# Patient Record
Sex: Female | Born: 1970 | Race: White | Hispanic: No | Marital: Married | Smoking: Former smoker
Health system: Southern US, Community
[De-identification: ages and names within clinical notes are randomized; demographics above are authoritative.]

## PROBLEM LIST (undated history)

## (undated) DIAGNOSIS — I10 Essential (primary) hypertension: Secondary | ICD-10-CM

## (undated) DIAGNOSIS — F410 Panic disorder [episodic paroxysmal anxiety] without agoraphobia: Secondary | ICD-10-CM

## (undated) DIAGNOSIS — E669 Obesity, unspecified: Secondary | ICD-10-CM

## (undated) DIAGNOSIS — F32A Depression, unspecified: Secondary | ICD-10-CM

## (undated) DIAGNOSIS — D649 Anemia, unspecified: Secondary | ICD-10-CM

## (undated) DIAGNOSIS — K219 Gastro-esophageal reflux disease without esophagitis: Secondary | ICD-10-CM

## (undated) DIAGNOSIS — R0789 Other chest pain: Secondary | ICD-10-CM

## (undated) DIAGNOSIS — F329 Major depressive disorder, single episode, unspecified: Secondary | ICD-10-CM

## (undated) HISTORY — DX: Panic disorder (episodic paroxysmal anxiety): F41.0

## (undated) HISTORY — DX: Gastro-esophageal reflux disease without esophagitis: K21.9

## (undated) HISTORY — DX: Major depressive disorder, single episode, unspecified: F32.9

## (undated) HISTORY — PX: VESICOVAGINAL FISTULA CLOSURE W/ TAH: SUR271

## (undated) HISTORY — DX: Obesity, unspecified: E66.9

## (undated) HISTORY — DX: Depression, unspecified: F32.A

## (undated) HISTORY — PX: TONSILLECTOMY AND ADENOIDECTOMY: SHX28

## (undated) HISTORY — DX: Essential (primary) hypertension: I10

## (undated) HISTORY — DX: Other chest pain: R07.89

## (undated) HISTORY — PX: OTHER SURGICAL HISTORY: SHX169

---

## 1997-11-07 ENCOUNTER — Other Ambulatory Visit: Admission: RE | Admit: 1997-11-07 | Discharge: 1997-11-07 | Payer: Self-pay | Admitting: Obstetrics and Gynecology

## 1998-04-29 ENCOUNTER — Inpatient Hospital Stay (HOSPITAL_COMMUNITY): Admission: AD | Admit: 1998-04-29 | Discharge: 1998-05-01 | Payer: Self-pay | Admitting: Obstetrics and Gynecology

## 1998-05-24 ENCOUNTER — Inpatient Hospital Stay (HOSPITAL_COMMUNITY): Admission: AD | Admit: 1998-05-24 | Discharge: 1998-05-27 | Payer: Self-pay | Admitting: Obstetrics & Gynecology

## 1998-05-28 ENCOUNTER — Encounter (HOSPITAL_COMMUNITY): Admission: RE | Admit: 1998-05-28 | Discharge: 1998-08-26 | Payer: Self-pay | Admitting: *Deleted

## 1998-06-22 ENCOUNTER — Other Ambulatory Visit: Admission: RE | Admit: 1998-06-22 | Discharge: 1998-06-22 | Payer: Self-pay | Admitting: Obstetrics and Gynecology

## 1998-10-25 ENCOUNTER — Encounter: Payer: Self-pay | Admitting: Emergency Medicine

## 1998-10-25 ENCOUNTER — Emergency Department (HOSPITAL_COMMUNITY): Admission: EM | Admit: 1998-10-25 | Discharge: 1998-10-25 | Payer: Self-pay | Admitting: Emergency Medicine

## 1999-06-03 ENCOUNTER — Encounter: Admission: RE | Admit: 1999-06-03 | Discharge: 1999-09-01 | Payer: Self-pay | Admitting: *Deleted

## 2000-07-26 ENCOUNTER — Encounter: Admission: RE | Admit: 2000-07-26 | Discharge: 2000-07-26 | Payer: Self-pay | Admitting: Otolaryngology

## 2000-07-26 ENCOUNTER — Encounter: Payer: Self-pay | Admitting: Otolaryngology

## 2001-09-04 ENCOUNTER — Other Ambulatory Visit: Admission: RE | Admit: 2001-09-04 | Discharge: 2001-09-04 | Payer: Self-pay | Admitting: *Deleted

## 2001-09-06 ENCOUNTER — Encounter: Admission: RE | Admit: 2001-09-06 | Discharge: 2001-09-06 | Payer: Self-pay | Admitting: *Deleted

## 2001-09-06 ENCOUNTER — Encounter: Payer: Self-pay | Admitting: *Deleted

## 2001-11-21 ENCOUNTER — Emergency Department (HOSPITAL_COMMUNITY): Admission: EM | Admit: 2001-11-21 | Discharge: 2001-11-21 | Payer: Self-pay | Admitting: Emergency Medicine

## 2002-10-17 ENCOUNTER — Encounter: Payer: Self-pay | Admitting: Emergency Medicine

## 2002-10-17 ENCOUNTER — Emergency Department (HOSPITAL_COMMUNITY): Admission: EM | Admit: 2002-10-17 | Discharge: 2002-10-17 | Payer: Self-pay | Admitting: Emergency Medicine

## 2002-10-18 ENCOUNTER — Emergency Department (HOSPITAL_COMMUNITY): Admission: EM | Admit: 2002-10-18 | Discharge: 2002-10-18 | Payer: Self-pay | Admitting: Emergency Medicine

## 2002-10-18 ENCOUNTER — Inpatient Hospital Stay (HOSPITAL_COMMUNITY): Admission: EM | Admit: 2002-10-18 | Discharge: 2002-10-21 | Payer: Self-pay | Admitting: Specialist

## 2002-11-19 ENCOUNTER — Emergency Department (HOSPITAL_COMMUNITY): Admission: EM | Admit: 2002-11-19 | Discharge: 2002-11-19 | Payer: Self-pay | Admitting: Emergency Medicine

## 2003-03-07 ENCOUNTER — Ambulatory Visit (HOSPITAL_COMMUNITY): Admission: RE | Admit: 2003-03-07 | Discharge: 2003-03-07 | Payer: Self-pay

## 2003-03-11 ENCOUNTER — Ambulatory Visit (HOSPITAL_COMMUNITY): Admission: RE | Admit: 2003-03-11 | Discharge: 2003-03-11 | Payer: Self-pay

## 2003-06-24 ENCOUNTER — Encounter: Admission: RE | Admit: 2003-06-24 | Discharge: 2003-06-24 | Payer: Self-pay | Admitting: Family Medicine

## 2003-08-22 ENCOUNTER — Emergency Department (HOSPITAL_COMMUNITY): Admission: EM | Admit: 2003-08-22 | Discharge: 2003-08-22 | Payer: Self-pay | Admitting: Emergency Medicine

## 2004-06-05 ENCOUNTER — Emergency Department (HOSPITAL_COMMUNITY): Admission: EM | Admit: 2004-06-05 | Discharge: 2004-06-05 | Payer: Self-pay | Admitting: Emergency Medicine

## 2005-05-24 ENCOUNTER — Ambulatory Visit: Payer: Self-pay | Admitting: Internal Medicine

## 2005-05-26 ENCOUNTER — Observation Stay (HOSPITAL_COMMUNITY): Admission: EM | Admit: 2005-05-26 | Discharge: 2005-05-26 | Payer: Self-pay | Admitting: Emergency Medicine

## 2005-06-08 ENCOUNTER — Ambulatory Visit: Payer: Self-pay

## 2005-06-14 ENCOUNTER — Ambulatory Visit: Payer: Self-pay | Admitting: Internal Medicine

## 2005-06-15 ENCOUNTER — Ambulatory Visit: Payer: Self-pay | Admitting: Internal Medicine

## 2005-10-16 ENCOUNTER — Encounter: Admission: RE | Admit: 2005-10-16 | Discharge: 2005-10-16 | Payer: Self-pay | Admitting: *Deleted

## 2008-09-28 ENCOUNTER — Encounter: Admission: RE | Admit: 2008-09-28 | Discharge: 2008-09-28 | Payer: Self-pay | Admitting: Sports Medicine

## 2009-06-03 ENCOUNTER — Ambulatory Visit: Payer: Self-pay | Admitting: Internal Medicine

## 2009-06-03 ENCOUNTER — Inpatient Hospital Stay (HOSPITAL_COMMUNITY): Admission: EM | Admit: 2009-06-03 | Discharge: 2009-06-04 | Payer: Self-pay | Admitting: Emergency Medicine

## 2009-06-09 ENCOUNTER — Encounter (INDEPENDENT_AMBULATORY_CARE_PROVIDER_SITE_OTHER): Payer: Self-pay | Admitting: *Deleted

## 2009-06-09 ENCOUNTER — Telehealth (INDEPENDENT_AMBULATORY_CARE_PROVIDER_SITE_OTHER): Payer: Self-pay

## 2009-06-10 ENCOUNTER — Encounter (HOSPITAL_COMMUNITY): Admission: RE | Admit: 2009-06-10 | Discharge: 2009-08-12 | Payer: Self-pay | Admitting: Internal Medicine

## 2009-06-10 ENCOUNTER — Ambulatory Visit: Payer: Self-pay

## 2009-06-10 ENCOUNTER — Ambulatory Visit: Payer: Self-pay | Admitting: Cardiology

## 2009-06-19 DIAGNOSIS — R0789 Other chest pain: Secondary | ICD-10-CM | POA: Insufficient documentation

## 2009-06-19 DIAGNOSIS — I10 Essential (primary) hypertension: Secondary | ICD-10-CM | POA: Insufficient documentation

## 2009-06-19 DIAGNOSIS — F329 Major depressive disorder, single episode, unspecified: Secondary | ICD-10-CM

## 2009-06-19 DIAGNOSIS — F410 Panic disorder [episodic paroxysmal anxiety] without agoraphobia: Secondary | ICD-10-CM | POA: Insufficient documentation

## 2009-06-19 DIAGNOSIS — K219 Gastro-esophageal reflux disease without esophagitis: Secondary | ICD-10-CM | POA: Insufficient documentation

## 2009-06-19 DIAGNOSIS — E669 Obesity, unspecified: Secondary | ICD-10-CM | POA: Insufficient documentation

## 2009-06-22 ENCOUNTER — Ambulatory Visit: Payer: Self-pay | Admitting: Internal Medicine

## 2010-09-04 ENCOUNTER — Encounter: Payer: Self-pay | Admitting: Internal Medicine

## 2010-09-05 ENCOUNTER — Encounter: Payer: Self-pay | Admitting: Internal Medicine

## 2010-11-18 LAB — CARDIAC PANEL(CRET KIN+CKTOT+MB+TROPI)
CK, MB: 0.8 ng/mL (ref 0.3–4.0)
CK, MB: 0.8 ng/mL (ref 0.3–4.0)
Relative Index: INVALID (ref 0.0–2.5)
Relative Index: INVALID (ref 0.0–2.5)
Total CK: 51 U/L (ref 7–177)
Total CK: 55 U/L (ref 7–177)
Troponin I: 0.01 ng/mL (ref 0.00–0.06)
Troponin I: 0.01 ng/mL (ref 0.00–0.06)

## 2010-11-18 LAB — CBC
HCT: 36.2 % (ref 36.0–46.0)
Hemoglobin: 12 g/dL (ref 12.0–15.0)
MCHC: 33.2 g/dL (ref 30.0–36.0)
MCV: 79.6 fL (ref 78.0–100.0)
Platelets: 343 10*3/uL (ref 150–400)
RBC: 4.55 MIL/uL (ref 3.87–5.11)
RDW: 14.4 % (ref 11.5–15.5)
WBC: 9.1 10*3/uL (ref 4.0–10.5)

## 2010-11-18 LAB — POCT CARDIAC MARKERS
CKMB, poc: <1 ng/mL — ABNORMAL LOW (ref 1.0–8.0)
CKMB, poc: <1 ng/mL — ABNORMAL LOW (ref 1.0–8.0)
Myoglobin, poc: 59 ng/mL (ref 12–200)
Myoglobin, poc: 60.7 ng/mL (ref 12–200)
Troponin i, poc: 0.1 ng/mL — ABNORMAL HIGH (ref 0.00–0.09)
Troponin i, poc: <0.05 ng/mL (ref 0.00–0.09)

## 2010-11-18 LAB — BASIC METABOLIC PANEL
BUN: 14 mg/dL (ref 6–23)
CO2: 28 mEq/L (ref 19–32)
Calcium: 9.1 mg/dL (ref 8.4–10.5)
Chloride: 99 mEq/L (ref 96–112)
Creatinine, Ser: 0.6 mg/dL (ref 0.4–1.2)
GFR calc Af Amer: >60 mL/min (ref >60)
GFR calc non Af Amer: >60 mL/min (ref >60)
Glucose, Bld: 90 mg/dL (ref 70–99)
Potassium: 3.2 mEq/L — ABNORMAL LOW (ref 3.5–5.1)
Sodium: 137 mEq/L (ref 135–145)

## 2010-11-18 LAB — CK TOTAL AND CKMB (NOT AT ARMC)
CK, MB: 1.3 ng/mL (ref 0.3–4.0)
Relative Index: INVALID (ref 0.0–2.5)
Total CK: 61 U/L (ref 7–177)

## 2010-11-18 LAB — TROPONIN I: Troponin I: 0.02 ng/mL (ref 0.00–0.06)

## 2010-12-31 NOTE — H&P (Signed)
Tammy Benton, Tammy Benton               ACCOUNT NO.:  [id number]   MEDICAL RECORD NO.:  [id number]          PATIENT TYPE:  EMS   LOCATION:  ED                           FACILITY:  [facility]   PHYSICIAN:  Mark A. Perini, M.D.   DATE OF BIRTH:  1971-04-14   DATE OF ADMISSION:  05/25/2005  DATE OF DISCHARGE:                                HISTORY & PHYSICAL   CHIEF COMPLAINT:  Chest pain.   HISTORY OF PRESENT ILLNESS:  Tammy Benton is a 40 year old female who presents  reporting on and off chest pain for a couple of days. Today, she has had  chest pain and pressure with associated diaphoresis and shortness of breath.  These episodes come on and off and last several minutes at a time. She  states that she has never had a pain like this before. She also reports left  arm, hand, and left leg numbness symptoms, which are persisting currently.  There has been no definite weakness on the left side and there have been no  other stroke-like symptoms including no visual disturbances and no dysphasia  noted. In the emergency room, her heart room is tachycardiac but other  evaluation is negative so far. We will admit her for further evaluation.   PAST MEDICAL HISTORY:  1.  Post partum depression.  2.  Gastroesophageal reflux disease.  3.  Sinus problems. She reports that she has had 3 sinus infections since      June of 2006.  4.  History of tonsillectomy and adenoidectomy.  5.  History of laparoscopic surgery for endometriosis.  6.  Possible history of hypertension. This has been monitored and she has      been borderline in the past.  7.  History of borderline diabetes per the patient but not on any diabetes      medicines currently.  8.  History of hyperlipidemia/dyslipidemia. Per the patient and not on any      current medicine at this time. This has also been monitored.   ALLERGIES:  No known drug allergies.   MEDICATIONS:  Prednisone taper. She took 60 mg this morning x1. Zoloft 100  mg daily,  Nexium 40 mg daily. She has rare Afrin use. The last time was 1  and 1/2 weeks ago. She uses a lot of Goodies Power, 2 to 3 doses a day. She  has been given Flonase today and took 1 dose of this. She has had __________  D 1/2 tablet today, given to her earlier today and she has been started on  Avelox 400 mg a day for possible sinus infection, again first dose today.   SOCIAL HISTORY:  She is married. She has 8 children but only 1 biologic  child. Seven are step-children. She did have some smoking history as a  teenager but quit at age 29. She uses occasional alcohol. No drug use.   FAMILY HISTORY:  Mother is alive at age 7 with some allergy problems and an  enlarged heart but no definite coronary disease. Father died of age 77 of  congestive heart failure. He had his  first sign of heart disease in his  early 2's. He had bypass at age 33 and type 2 diabetes.   REVIEW OF SYSTEMS:  She has been weak. She has lost 20 pounds intentionally  recently. Two weeks ago, she had a headache, cough, congestion, drainage,  and ear pain. She did take a course of Augmentin, which was completed about  2 weeks ago. She denies any fevers. She has had significant sinus type of  symptoms in the last week, with significant pressure behind her eyes and in  her maxillary sinus area. She went to an urgent care today and was then  given the prednisone prescription as well as the Avelox, Flonase, and the  decongestant. She does report left hand and left leg numbness today. There  has been no definite weakness. The chest pain and chest pain pressures have  been ongoing during the day today, on and off, although she is currently  pain free.   PHYSICAL EXAMINATION:  VITAL SIGNS:  Temperature 96.6, blood pressure  145/87, pulse 124 and sinus rhythm. Respiratory rate 20. Oxygen saturation  98%. Weight is 222 pounds. She is an obese female who appears older than her  stated age. She has poor dentition.  HEENT:   Normocephalic and atraumatic. Pupils are equal, round, and reactive  to light. Extraocular movements are intact. There are no bruits. Tympanic  membranes are normal bilaterally. Nares are clear bilaterally. Oropharynx is  clear.  LUNGS:  Clear to auscultation bilaterally with no wheezes, rales, or  rhonchi.  HEART:  Tachycardiac with a 1 to 2 over 6 murmur heard at the left sternal  border in early systole. Heart sounds are hyper-dynamic.  ABDOMEN:  Soft, obese, non-tender with no masses.  EXTREMITIES:  There is no peripheral edema.  NEUROLOGIC:  Cranial nerves 2-12 are normal. She moves extremities x4 and  has normal strength in the upper and lower extremities. She has 2+ deep  tendon reflexes throughout the upper and lower extremities. There is no  definite sensory changes to palpation. She has no pronator drift and no  Romberg sign.   LABORATORY DATA:  Chest x-ray personally reviewed shows no active disease.   EKG shows sinus tachycardia with a rate of 118 beats per minute. Normal axis  and RSR primed pattern. There is normal R wave progression. No pathologic Q  waves. There are some inferior ST and T wave flattening but this is  unchanged compared to a January of 2005 EKG.   Sodium 134, potassium 3.7, chloride 102, CO2 22, BUN 12, creatinine 0.6,  glucose 169, calcium 9.2. White count is 11.5 with 91% segs, 8% lymphocytes,  1% monocyte. Hemoglobin 12.6. Platelet count 409,000. D-dimer is negative at  less than 0.22. One set of cardiac enzymes shows a myoglobin of 39.8, MB of  less than 1.0 and a troponin I of less than 0.05.   ASSESSMENT/PLAN:  A 40 year old female with risk factors for atherosclerotic  coronary artery disease including probable hypertension, obesity, and  possible sedentary lifestyle, strong family history of coronary disease, and  she also has dysmetabolic syndrome by her history. She does have atypical chest symptoms at this time. Her enzymes are negative and  her initial EKG  does not show any definitive changes, although she is tachycardiac. We will  admit her and rule her out with serial enzymes and EKG's. Will obtain a  cardiology consult for further evaluation. I have advised her to stop  Goodies Powder use. I have  advised her against using Afrin. We will continue  PPI and in fact, we will give her twice daily Protonix while she is in  house. I will stop her prednisone currently but I will continue her Avelox  for possible underlying sinus infection. Will place her on deep vein  thrombosis prophylactic dose  Lovenox. Will continue with the Protonix for ulcer prophylaxis and treatment  of her underlying reflux. She also has left sided numbness symptoms, which  are vague. There is no objective neurologic symptoms currently. This will  need to be monitored and I will defer to the patient's primary physician,  whether any imaging is warranted.           ______________________________  Redge Gainer. Waynard Edwards, M.D.     MAP/MEDQ  D:  05/26/2005  T:  05/26/2005  Job:  [id number]   cc:   Kari Baars, M.D.  Fax: [phone]

## 2010-12-31 NOTE — Discharge Summary (Signed)
[name], Benton               ACCOUNT NO.:  [id number]   MEDICAL RECORD NO.:  [id number]          PATIENT TYPE:  OBV   LOCATION:  0151                         FACILITY:  [facility]   PHYSICIAN:  Kari Baars, M.D.  DATE OF BIRTH:  [birth year]   DATE OF ADMISSION:  05/25/2005  DATE OF DISCHARGE:  05/26/2005                                 DISCHARGE SUMMARY   DISCHARGE DIAGNOSES:  1.  Atypical chest pain.  2.  Gastroesophageal reflux disease, possible gastritis, secondary to [facility]      powder use.  3.  Acute sinusitis.  4.  Sinus tachycardia, resolved.  5.  Chronic daily headache.  6.  Borderline hypertension.  7.  Hyperlipidemia.  8.  Glucose intolerance.  9.  Metabolic syndrome.  10. Endometriosis, status post laparoscopic resection.  11. Depression.  12. Recurrent sinusitis.   DISCHARGE MEDICATIONS:  1.  Zoloft 100 mg daily.  2.  Avelox 400 mg daily x10 days.  3.  Flonase nasal spray q.h.s.  4.  Nexium 40 mg twice daily x2 weeks, then daily.  5.  Coricidin HBP p.r.n.  6.  Mucinex DM p.r.n. cough.  7.  Do not take prednisone, Afrin, or Goody powders.   HISTORY OF PRESENT ILLNESS:  For full details, please see dictated history  and physical by Dr. Waynard Edwards.  Briefly, Tammy Benton is a 40 year old white  female with obesity, glucose intolerance, borderline hypertension, and  hyperlipidemia (metabolic syndrome), who presented to the emergency  department on October 11 with intermittent chest pain for the past couple of  days.  She had been seen in urgent care for symptoms consistent with sinus  infection and was prescribed prednisone, Avelox, in addition to the Afrin  nasal spray and Goody powders that she was taking for her sinus headache and  chronic daily headaches.  Following this, she developed recurrent episodes  of chest pain associated with diaphoresis and shortness of breath, prompting  her to present to the emergency department.  In the emergency department,  she  was tachycardic with a heart rate of 124.  Exam and laboratory  evaluation was otherwise unremarkable.  EKG was unchanged, compared to prior  EKGs.  Cardiac enzymes were negative.  Given her symptoms and her  tachycardia, the patient was admitted for further management.   HOSPITAL COURSE:  The patient was admitted to a telemetry bed.  Dr.  Gala Romney of [facility] was asked to see the patient.  He evaluated  the patient and agreed that her symptoms were atypical and likely related to  her underlying upper respiratory infection and gastroesophageal reflux  disease with possible gastritis in the setting of her Goody powder use.  She  did rule out for myocardial infarction with serial enzymes.  Attempts to  arrange functional study prior to discharge were unsuccessful; however, the  patient is at low risk and will undergo an outpatient cardiac evaluation by  Dr. Gala Romney.  Her chest pain resolved.  Her tachycardia resolved and was  felt to be due to the use of Afrin and stimulants for her upper respiratory  infection.  Given resolution of her chest pain and negative cardiac enzymes, the patient  will be discharged home with outpatient followup.   DISCHARGE INSTRUCTIONS:  Patient was instructed not to take the prednisone,  Goody powders, or Afrin nasal spray.  She was instructed to call with  prolonged episodes of chest pain.   HOSPITAL FOLLOW UP:  She will follow up with Dr. Gala Romney within the next  week or further evaluation.  His office will arrange that followup.  She  should follow up with Dr. Clelia Croft in the next 2-3 weeks.   DISPOSITION:  To home.      Kari Baars, M.D.  Electronically Signed     WS/MEDQ  D:  06/10/2005  T:  06/10/2005  Job:  [id number]   cc:   Arvilla Meres, M.D. [facility]  [employer]  [address]  [place]  [place] [address]

## 2010-12-31 NOTE — Consult Note (Signed)
NAMEENSLEE, BIBBINS NO.:  [id number]   MEDICAL RECORD NO.:  [id number]          PATIENT TYPE:  EMS   LOCATION:  ED                           FACILITY:  [facility]   PHYSICIAN:  Arvilla Meres, M.D. LHCDATE OF BIRTH:  12-24-1970   DATE OF CONSULTATION:  05/26/2005  DATE OF DISCHARGE:                                   CONSULTATION   PRIMARY CARE PHYSICIAN:  Kari Baars, M.D., she is new to [facility].   CHIEF COMPLAINT:  Chest pain.   PATIENT IDENTIFICATION:  Ms. Quarry is a very pleasant 40 year old woman  with a metabolic syndrome.  She denies any history of known CAD.  She did  have an episode of chest pain and shortness of breath in 2005 which she had  evaluated in the [facility] ER.  By her report, the ECG was negative at  that time.  She also reportedly had an exercise treadmill test (without  imaging) which she also says were negative.  The details of this are  unavailable.  Her chest pain has been relatively quiescent since that time.  However, the past 2-3 weeks she has had intermittent squeezing chest pain at  both rest and exertion. This has been transient lasting just a minute or two  and resolving spontaneously.  However, there has been some increased  frequency.  There have been no other associated symptoms.  Today, she went  to Urgent Care for recurrent sinus infection.  She received antibiotics as  well as Flonase and prednisone.  She was feeling well in the afternoon, and  then she developed a severe headache, followed about two hours later by a  severe 8/10 squeezing chest pain.  This was accompanied by some diaphoresis  and weakness.  The pain lasted three minutes and resolved spontaneously.  She also experienced some left arm and leg numbness, but no weakness or  other neurological symptoms.  She took her blood pressure and noticed that  it was 140/96, so she came to the ER for further evaluation.  In the ER, the  workup was  notable for a sinus tachycardia with heart rates in the 120s.  Her D. dimer and point of care markers were negative x1.   REVIEW OF SYSTEMS:  She has occasional orthopnea, but denies lower extremity  edema.  She does have a history of heavy snoring, but reportedly had a sleep  study several years ago which was negative.  She does have frequent problems  with sinus headaches.  She has a history of obesity, but has recently lost  up to 30 or 40 pounds.  She denies any fevers or chills.   Otherwise, review of systems is negative except as per HPI in problem list.   PAST MEDICAL HISTORY:  1.  Obesity.  2.  Borderline hypertension.  3.  Hyperlipidemia.  4.  Glucose intolerance.  5.  Metabolic syndrome.  6.  Endometriosis status post laparoscopic resection.  7.  Gastroesophageal reflux disease  8.  Depression.  9.  Recurrent sinus infections.   CURRENT MEDICATIONS:  1.  Nexium.  2.  Zoloft 100 mg   ALLERGIES:  No known drug allergies.   SOCIAL HISTORY:  She lives in [place] with her husband.  She is a  housewife.  She has 8 kids including 7 step-children.  Occasional alcohol.  Has history of tobacco but quit 16 years ago.   FAMILY HISTORY:  Notable for father who died at 34 due to congestive heart  failure.  He had a bypass surgery in his 65s.  Her mother is 63 and alive  and well.  She has a sister who is 48 and healthy.   PHYSICAL EXAMINATION:  GENERAL APPEARANCE:  She is able to lie flat in bed  in no acute distress.  Respirations are unlabored.  VITAL SIGNS:  Blood pressure 120/70, heart rate 111.  She is saturating 97%  on room air.  HEENT:  Sclerae are anicteric.  EOMI.  There is no xanthelasma.  Mucous  membranes moist.  NECK:  Supple.  No obvious JVD.  Carotids are 2+ bilaterally without any  bruits.  No lymphadenopathy or thyromegaly.  CARDIAC:  Tachycardic and regular.  Normal S1, S2.  There is a soft systolic  ejection murmur at the left sternal border.  No rub  or gallop.  She is  exquisitely tender to palpation over her sternum.  LUNGS:  Clear to auscultation.  ABDOMEN:  Obese.  She has mild tenderness from the epigastrium, but this is  minimal.  There is no right upper quadrant tenderness.  There is negative  Murphy's sign.  There are good bowel sounds.  There is no rebound or  guarding.  There is no bruits or masses.  EXTREMITIES:  Warm with no cyanosis, clubbing or edema.  Distal pulses are  strong.  NEUROLOGICAL:  Alert and oriented x3.  Moves all four extremities without  any difficulty.  Strength is 5/5 and symmetric throughout upper and lower  extremities.  She has a bright affect.   STUDIES:  EKG shows sinus tachycardia with no significant ST-T wave changes.  Chest x-ray shows no acute disease.  Point of care markers show troponin I  of less than 0.05, CK MB less than 1.0.  CBC:  White count 11.5, hematocrit  37.9,  hemoglobin 12.6, platelets 409,000.  Sodium 134, potassium 3.7,  chloride 102, bicarbonate 22, glucose 169, BUN 12, creatinine 0.8.  D. dimer  is less than 0.22.   ASSESSMENT/PLAN:  1.  Chest pain.  This is fairly atypical, and she has significant chest wall      tenderness on exam.  However, given her cardiac risk factors and family      history, I do think it is reasonable to admit her for rule out MI and      proceed with a treadmill Cardiolite in the morning.  2.  Sinus tachycardia.  I am unclear of the etiology for this.  She has no      evidence of fever or anemia.  We will proceed with checking a thyroid      panel as well as checking an echocardiogram to make sure her LV function      is normal.  3.  Left sided numbness.  Currently, she has no motor findings on physical      exam.  I will leave this workup to the primary team.  4.  Cardiac risk factors.  Her blood pressure, cholesterol and borderline      diabetes are all being handled by her primary care physicians.  We appreciated the consult and will  continue to follow with you in the  hospital.     Arvilla Meres, M.D. [facility]  Electronically Signed    DB/MEDQ  D:  05/26/2005  T:  05/26/2005  Job:  [id number]

## 2011-02-15 ENCOUNTER — Encounter: Payer: Self-pay | Admitting: Internal Medicine

## 2011-06-17 ENCOUNTER — Other Ambulatory Visit (HOSPITAL_COMMUNITY): Payer: Self-pay | Admitting: Orthopedic Surgery

## 2011-06-17 DIAGNOSIS — M25561 Pain in right knee: Secondary | ICD-10-CM

## 2011-06-18 ENCOUNTER — Other Ambulatory Visit (HOSPITAL_COMMUNITY): Payer: Self-pay

## 2011-07-01 ENCOUNTER — Other Ambulatory Visit (HOSPITAL_COMMUNITY): Payer: Self-pay

## 2011-07-05 ENCOUNTER — Ambulatory Visit (HOSPITAL_COMMUNITY)
Admission: RE | Admit: 2011-07-05 | Discharge: 2011-07-05 | Disposition: A | Discharge status: Home / Self Care | Payer: Self-pay | Source: Ambulatory Visit | Attending: Orthopedic Surgery | Admitting: Orthopedic Surgery

## 2011-07-05 DIAGNOSIS — M25569 Pain in unspecified knee: Secondary | ICD-10-CM | POA: Insufficient documentation

## 2011-07-05 DIAGNOSIS — M25561 Pain in right knee: Secondary | ICD-10-CM

## 2011-07-05 DIAGNOSIS — M6740 Ganglion, unspecified site: Secondary | ICD-10-CM | POA: Insufficient documentation

## 2012-06-25 ENCOUNTER — Emergency Department (HOSPITAL_COMMUNITY)
Admission: EM | Admit: 2012-06-25 | Discharge: 2012-06-25 | Disposition: A | Discharge status: Home / Self Care | Payer: Self-pay | Attending: Emergency Medicine | Admitting: Emergency Medicine

## 2012-06-25 ENCOUNTER — Encounter (HOSPITAL_COMMUNITY): Payer: Self-pay | Admitting: Emergency Medicine

## 2012-06-25 ENCOUNTER — Emergency Department (HOSPITAL_COMMUNITY): Payer: Self-pay

## 2012-06-25 DIAGNOSIS — F3289 Other specified depressive episodes: Secondary | ICD-10-CM | POA: Insufficient documentation

## 2012-06-25 DIAGNOSIS — J9801 Acute bronchospasm: Secondary | ICD-10-CM | POA: Insufficient documentation

## 2012-06-25 DIAGNOSIS — R059 Cough, unspecified: Secondary | ICD-10-CM | POA: Insufficient documentation

## 2012-06-25 DIAGNOSIS — R05 Cough: Secondary | ICD-10-CM | POA: Insufficient documentation

## 2012-06-25 DIAGNOSIS — I10 Essential (primary) hypertension: Secondary | ICD-10-CM | POA: Insufficient documentation

## 2012-06-25 DIAGNOSIS — Z8719 Personal history of other diseases of the digestive system: Secondary | ICD-10-CM | POA: Insufficient documentation

## 2012-06-25 DIAGNOSIS — E669 Obesity, unspecified: Secondary | ICD-10-CM | POA: Insufficient documentation

## 2012-06-25 DIAGNOSIS — Z79899 Other long term (current) drug therapy: Secondary | ICD-10-CM | POA: Insufficient documentation

## 2012-06-25 DIAGNOSIS — F329 Major depressive disorder, single episode, unspecified: Secondary | ICD-10-CM | POA: Insufficient documentation

## 2012-06-25 DIAGNOSIS — F4001 Agoraphobia with panic disorder: Secondary | ICD-10-CM | POA: Insufficient documentation

## 2012-06-25 LAB — CBC
HCT: 36.9 % (ref 36.0–46.0)
MCV: 79.7 fL (ref 78.0–100.0)
Platelets: 216 10*3/uL (ref 150–400)
RBC: 4.63 MIL/uL (ref 3.87–5.11)
RDW: 13.9 % (ref 11.5–15.5)
WBC: 3.5 10*3/uL — ABNORMAL LOW (ref 4.0–10.5)

## 2012-06-25 LAB — BASIC METABOLIC PANEL
BUN: 8 mg/dL (ref 6–23)
CO2: 30 mEq/L (ref 19–32)
Chloride: 99 mEq/L (ref 96–112)
Creatinine, Ser: 0.62 mg/dL (ref 0.50–1.10)
GFR calc Af Amer: >90 mL/min (ref >90)
Potassium: 3.7 mEq/L (ref 3.5–5.1)

## 2012-06-25 LAB — PRO B NATRIURETIC PEPTIDE: Pro B Natriuretic peptide (BNP): <5 pg/mL (ref 0–125)

## 2012-06-25 MED ORDER — ALBUTEROL SULFATE HFA 108 (90 BASE) MCG/ACT IN AERS
2.0000 | INHALATION_SPRAY | RESPIRATORY_TRACT | Status: DC
  Administered 2012-06-25: 2 via RESPIRATORY_TRACT
  Filled 2012-06-25: qty 6.7

## 2012-06-25 MED ORDER — METHYLPREDNISOLONE SODIUM SUCC 125 MG IJ SOLR
125.0000 mg | Freq: Once | INTRAMUSCULAR | Status: AC
  Administered 2012-06-25: 125 mg via INTRAVENOUS
  Filled 2012-06-25: qty 2

## 2012-06-25 MED ORDER — ALBUTEROL (5 MG/ML) CONTINUOUS INHALATION SOLN
10.0000 mg/h | INHALATION_SOLUTION | Freq: Once | RESPIRATORY_TRACT | Status: AC
  Administered 2012-06-25: 10 mg/h via RESPIRATORY_TRACT

## 2012-06-25 MED ORDER — ALBUTEROL SULFATE (5 MG/ML) 0.5% IN NEBU
5.0000 mg | INHALATION_SOLUTION | Freq: Once | RESPIRATORY_TRACT | Status: DC
  Filled 2012-06-25: qty 1

## 2012-06-25 MED ORDER — PREDNISONE 10 MG PO TABS: 20.0000 mg | ORAL_TABLET | Freq: Every day | ORAL | Status: DC

## 2012-06-25 MED ORDER — IPRATROPIUM BROMIDE 0.02 % IN SOLN
0.5000 mg | Freq: Once | RESPIRATORY_TRACT | Status: AC
  Administered 2012-06-25: 0.5 mg via RESPIRATORY_TRACT
  Filled 2012-06-25: qty 2.5

## 2012-06-25 NOTE — ED Provider Notes (Addendum)
History     CSN: [id number]  Arrival date & time 06/25/12  1202   First MD Initiated Contact with Patient 06/25/12 1248      Chief Complaint  Patient presents with  . Chest Pain  . Cough    (Consider location/radiation/quality/duration/timing/severity/associated sxs/prior treatment) Patient is a 41 y.o. female presenting with chest pain and cough. The history is provided by the patient.  Chest Pain Primary symptoms include cough.    Cough Associated symptoms include chest pain.  pt here with cough x 3 days productive of green sputum--no fever--notes sharp chest pain worse with cough--also notes remote ankle injury--h/o similar sx a/w bronchitis--no tx used pta--denies anginal type sx  Past Medical History  Diagnosis Date  . 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 HTN (hypertension)   . GERD (gastroesophageal reflux disease)   . Obesity   . Depression   . Panic disorder without agoraphobia     Past Surgical History  Procedure Date  . Vesicovaginal fistula closure w/ tah   . Tonsillectomy and adenoidectomy   . Endometriosis s/p surgery     Family History  Problem Relation Age of Onset  . Heart attack      both grandparents  . Hypertension      family hx  . Diabetes      family hx    History  Substance Use Topics  . Smoking status: Unknown If Ever Smoked  . Smokeless tobacco: Not on file     Comment: does not smoke   . Alcohol Use: Yes     Comment: occasional     OB History    Grav Para Term Preterm Abortions TAB SAB Ect Mult Living                  Review of Systems  Respiratory: Positive for cough.   Cardiovascular: Positive for chest pain.  All other systems reviewed and are negative.    Allergies  Doxycycline  Home Medications   Current Outpatient Rx  Name  Route  Sig  Dispense  Refill  . CLONAZEPAM 2 MG PO TABS   Oral   Take 2 mg by mouth daily.           Marland Kitchen  HYDROCODONE-ACETAMINOPHEN 5-500 MG PO TABS   Oral   Take 1 tablet by mouth every 6 (six) hours as needed.           Marland Kitchen LISINOPRIL 20 MG PO TABS   Oral   Take 20 mg by mouth daily.           . SERTRALINE HCL 100 MG PO TABS   Oral   Take 100 mg by mouth daily.             BP 155/102  Pulse 101  Temp 98.3 F (36.8 C) (Oral)  Resp 24  SpO2 98%  LMP 05/25/2012  Physical Exam  Nursing note and vitals reviewed. Constitutional: She is oriented to person, place, and time. She appears well-developed and well-nourished.  Non-toxic appearance. No distress.  HENT:  Head: Normocephalic and atraumatic.  Eyes: Conjunctivae normal, EOM and lids are normal. Pupils are equal, round, and reactive to light.  Neck: Normal range of motion. Neck supple. No tracheal deviation present. No mass present.  Cardiovascular: Normal rate, regular rhythm and normal heart sounds.  Exam reveals no gallop.   No murmur heard. Pulmonary/Chest: Effort normal. No stridor.  No respiratory distress. She has decreased breath sounds. She has wheezes. She has no rhonchi. She has no rales.  Abdominal: Soft. Normal appearance and bowel sounds are normal. She exhibits no distension. There is no tenderness. There is no rebound and no CVA tenderness.  Musculoskeletal: Normal range of motion. She exhibits no edema and no tenderness.       Right foot: She exhibits tenderness. She exhibits no swelling.  Neurological: She is alert and oriented to person, place, and time. She has normal strength. No cranial nerve deficit or sensory deficit. GCS eye subscore is 4. GCS verbal subscore is 5. GCS motor subscore is 6.  Skin: Skin is warm and dry. No abrasion and no rash noted.  Psychiatric: She has a normal mood and affect. Her speech is normal and behavior is normal.    ED Course  Procedures (including critical care time)   Labs Reviewed  CBC  BASIC METABOLIC PANEL  PRO B NATRIURETIC PEPTIDE   No results found.   No  diagnosis found.    MDM  Pt given albuterol and solumedrol--rechecked and wheezing improved, will repeat albuterol tx and d/c--  No concern for pe or pneumonia  CRITICAL CARE Performed by: Toy Baker   Total critical care time: 60  Critical care time was exclusive of separately billable procedures and treating other patients.  Critical care was necessary to treat or prevent imminent or life-threatening deterioration.  Critical care was time spent personally by me on the following activities: development of treatment plan with patient and/or surrogate as well as nursing, discussions with consultants, evaluation of patient's response to treatment, examination of patient, obtaining history from patient or surrogate, ordering and performing treatments and interventions, ordering and review of laboratory studies, ordering and review of radiographic studies, pulse oximetry and re-evaluation of patient's condition.         Toy Baker, MD 06/25/12 1518  Toy Baker, MD 06/25/12 [phone]

## 2012-06-25 NOTE — ED Notes (Signed)
Pt presenting to ed with c/o chest pain pt denies nausea and vomiting. Pt states positive cough that's brownish colored. Pt states chest pain is worse with cough. Pt states positive congestion

## 2012-06-25 NOTE — ED Notes (Signed)
Pt discharged home via self; pt given and explained all discharge instructions; pt stated understanding; returned demonstration on using inhaler; pt stable at time of discharge

## 2012-08-04 ENCOUNTER — Emergency Department (HOSPITAL_COMMUNITY): Payer: Self-pay

## 2012-08-04 ENCOUNTER — Encounter (HOSPITAL_COMMUNITY): Payer: Self-pay | Admitting: Emergency Medicine

## 2012-08-04 ENCOUNTER — Emergency Department (HOSPITAL_COMMUNITY)
Admission: EM | Admit: 2012-08-04 | Discharge: 2012-08-04 | Disposition: A | Discharge status: Home / Self Care | Payer: Self-pay | Attending: Emergency Medicine | Admitting: Emergency Medicine

## 2012-08-04 DIAGNOSIS — R0982 Postnasal drip: Secondary | ICD-10-CM | POA: Insufficient documentation

## 2012-08-04 DIAGNOSIS — J3489 Other specified disorders of nose and nasal sinuses: Secondary | ICD-10-CM | POA: Insufficient documentation

## 2012-08-04 DIAGNOSIS — K219 Gastro-esophageal reflux disease without esophagitis: Secondary | ICD-10-CM | POA: Insufficient documentation

## 2012-08-04 DIAGNOSIS — I10 Essential (primary) hypertension: Secondary | ICD-10-CM | POA: Insufficient documentation

## 2012-08-04 DIAGNOSIS — Y929 Unspecified place or not applicable: Secondary | ICD-10-CM | POA: Insufficient documentation

## 2012-08-04 DIAGNOSIS — Z79899 Other long term (current) drug therapy: Secondary | ICD-10-CM | POA: Insufficient documentation

## 2012-08-04 DIAGNOSIS — X58XXXA Exposure to other specified factors, initial encounter: Secondary | ICD-10-CM | POA: Insufficient documentation

## 2012-08-04 DIAGNOSIS — S82899A Other fracture of unspecified lower leg, initial encounter for closed fracture: Secondary | ICD-10-CM | POA: Insufficient documentation

## 2012-08-04 DIAGNOSIS — Z87891 Personal history of nicotine dependence: Secondary | ICD-10-CM | POA: Insufficient documentation

## 2012-08-04 DIAGNOSIS — S82839A Other fracture of upper and lower end of unspecified fibula, initial encounter for closed fracture: Secondary | ICD-10-CM

## 2012-08-04 DIAGNOSIS — R220 Localized swelling, mass and lump, head: Secondary | ICD-10-CM | POA: Insufficient documentation

## 2012-08-04 DIAGNOSIS — Z8679 Personal history of other diseases of the circulatory system: Secondary | ICD-10-CM | POA: Insufficient documentation

## 2012-08-04 DIAGNOSIS — H9209 Otalgia, unspecified ear: Secondary | ICD-10-CM | POA: Insufficient documentation

## 2012-08-04 DIAGNOSIS — J329 Chronic sinusitis, unspecified: Secondary | ICD-10-CM | POA: Insufficient documentation

## 2012-08-04 DIAGNOSIS — Y939 Activity, unspecified: Secondary | ICD-10-CM | POA: Insufficient documentation

## 2012-08-04 DIAGNOSIS — E669 Obesity, unspecified: Secondary | ICD-10-CM | POA: Insufficient documentation

## 2012-08-04 DIAGNOSIS — Z8659 Personal history of other mental and behavioral disorders: Secondary | ICD-10-CM | POA: Insufficient documentation

## 2012-08-04 DIAGNOSIS — Z862 Personal history of diseases of the blood and blood-forming organs and certain disorders involving the immune mechanism: Secondary | ICD-10-CM | POA: Insufficient documentation

## 2012-08-04 HISTORY — DX: Anemia, unspecified: D64.9

## 2012-08-04 MED ORDER — AMOXICILLIN 250 MG PO CAPS: 250.0000 mg | ORAL_CAPSULE | Freq: Two times a day (BID) | ORAL | Status: AC

## 2012-08-04 MED ORDER — HYDROCODONE-ACETAMINOPHEN 5-325 MG PO TABS: 1.0000 | ORAL_TABLET | Freq: Four times a day (QID) | ORAL | Status: AC | PRN

## 2012-08-04 MED ORDER — OXYMETAZOLINE HCL 0.05 % NA SOLN
1.0000 | Freq: Once | NASAL | Status: AC
  Administered 2012-08-04: 1 via NASAL
  Filled 2012-08-04: qty 15

## 2012-08-04 NOTE — ED Provider Notes (Signed)
Medical screening examination/treatment/procedure(s) were performed by non-physician practitioner and as supervising physician I was immediately available for consultation/collaboration.   Rolan Bucco, MD 08/04/12 [phone]

## 2012-08-04 NOTE — ED Notes (Signed)
Pt presents w/ c/o facial swelling, tingling and burning around her eyes that started yesterday morning. Pt also c/o right ankle pain, painful w/ weight bearing denies any injury.

## 2012-08-04 NOTE — ED Provider Notes (Signed)
History     CSN: [id number]  Arrival date & time 08/04/12  1200   First MD Initiated Contact with Patient 08/04/12 1231      Chief Complaint  Patient presents with  . Ankle Pain  . Facial Swelling    (Consider location/radiation/quality/duration/timing/severity/associated sxs/prior treatment) HPI Comments: Tammy Benton is a 41 y.o. female  with a history of obesity, depression and panic disorder presents emergency department complaining of sinusitis type symptoms.  Onset of sinus congestion began approximately one week ago, however patient has been unable to afford her Claritin-D.  In addition to congestion and rhinorrhea patient reports bilateral ear popping and right-sided otalgia, but she denies any draining from the ears, mastoid tenderness, sore throat, cough, chest pain, shortness of breath, fevers, night sweats or chills.  Patient reports yesterday when she woke up she felt her face was swollen and tingling from the pressure of the sinus congestion.  In addition she reports right ankle tenderness with weightbearing.  The ankle was evaluated with x-ray back in July (5 months ago) and there is no sign of fracture or dislocation at that time.  Patient reports that she suffers from intermittent swelling.  She did not have orthopedic to followup with your primary care.   The history is provided by the patient.    Past Medical History  Diagnosis Date  . 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 HTN (hypertension)   . GERD (gastroesophageal reflux disease)   . Obesity   . Depression   . Panic disorder without agoraphobia   . Anemia     Past Surgical History  Procedure Date  . Vesicovaginal fistula closure w/ tah   . Tonsillectomy and adenoidectomy   . Endometriosis s/p surgery     Family History  Problem Relation Age of Onset  . Heart attack      both grandparents  . Hypertension      family hx  . Diabetes      family  hx    History  Substance Use Topics  . Smoking status: Former Games developer  . Smokeless tobacco: Never Used     Comment: does not smoke   . Alcohol Use: Yes     Comment: occasional     OB History    Grav Para Term Preterm Abortions TAB SAB Ect Mult Living                  Review of Systems  Constitutional: Negative for fever, chills, appetite change and fatigue.  HENT: Positive for ear pain, congestion, rhinorrhea and postnasal drip. Negative for hearing loss, nosebleeds, sore throat, sneezing, trouble swallowing, neck stiffness, voice change, sinus pressure, tinnitus and ear discharge.   Eyes: Negative for photophobia and visual disturbance.  Respiratory: Negative for apnea, cough, choking, chest tightness, shortness of breath, wheezing and stridor.   Cardiovascular: Negative for chest pain, palpitations and leg swelling.  Gastrointestinal: Negative for nausea, vomiting, abdominal pain, diarrhea and constipation.  Genitourinary: Negative for dysuria, urgency and flank pain.  Musculoskeletal: Negative for myalgias and gait problem.  Neurological: Negative for dizziness, seizures, syncope, weakness, light-headedness, numbness and headaches.  Psychiatric/Behavioral: Negative for behavioral problems and confusion.  All other systems reviewed and are negative.    Allergies  Doxycycline  Home Medications   Current Outpatient Rx  Name  Route  Sig  Dispense  Refill  . CLONAZEPAM 2 MG PO TABS  Oral   Take 2 mg by mouth daily.          Marland Kitchen DIPHENHYDRAMINE HCL 25 MG PO CAPS   Oral   Take 25 mg by mouth every 6 (six) hours as needed. allergies         . GABAPENTIN 300 MG PO CAPS   Oral   Take 600 mg by mouth 3 (three) times daily.         Marland Kitchen HYDROCODONE-ACETAMINOPHEN 5-500 MG PO TABS   Oral   Take 1 tablet by mouth every 6 (six) hours as needed. For pain.         Marland Kitchen LISINOPRIL-HYDROCHLOROTHIAZIDE 10-12.5 MG PO TABS   Oral   Take 1 tablet by mouth daily.         Marland Kitchen  LORATADINE-PSEUDOEPHEDRINE ER 10-240 MG PO TB24   Oral   Take 1 tablet by mouth daily.         Marland Kitchen OMEPRAZOLE 20 MG PO CPDR   Oral   Take 20 mg by mouth daily.         . SERTRALINE HCL 100 MG PO TABS   Oral   Take 100 mg by mouth daily.             BP 156/70  Pulse 101  Temp 98.6 F (37 C) (Oral)  Resp 14  SpO2 95%  LMP 07/25/2012  Physical Exam  Nursing note and vitals reviewed. Constitutional: She is oriented to person, place, and time. She appears well-developed and well-nourished. No distress.  HENT:  Head: Normocephalic and atraumatic.       Nasal congestion & rhinorrhea. Mild ttp over frontal & maxilla sinuses. Normal L & R external ear canals and TMs. No tragal or mastoid tenderness. Oropharynx moist, without tonsillar exudate.   Eyes: Conjunctivae normal and EOM are normal. Pupils are equal, round, and reactive to light.       No pain w EOM. Conjunctiva normal    Neck: Normal range of motion. Neck supple.       Soft, no nuchal rigidity. No lymphadenopathy  Cardiovascular: Normal heart sounds and intact distal pulses.        RRR, no aberrancy on auscultation  Pulmonary/Chest: Effort normal.       LCAB, no respiratory distress  Musculoskeletal: Normal range of motion.       TTP over right lateral malleolus. All extremities with normal ROM  Neurological: She is alert and oriented to person, place, and time.       Normal gait. No sensory deficit  Skin: She is not diaphoretic.       No rash or ecchymosis.   Psychiatric: Her behavior is normal.    ED Course  Procedures (including critical care time)  Labs Reviewed - No data to display Dg Ankle Complete Right  08/04/2012  *RADIOLOGY REPORT*  Clinical Data: Ankle pain, facial swelling  RIGHT ANKLE - COMPLETE 3+ VIEW  Comparison: None.  Findings: Three views of the right ankle submitted.  There is a small avulsion fracture at the tip of distal fibula of indeterminate age.  Mild soft tissue swelling adjacent to  lateral malleolus.  Ankle mortise is preserved.  Tiny plantar spur of the calcaneus.  IMPRESSION:  There is a small avulsion fracture at the tip of distal fibula of indeterminate age.  Mild soft tissue swelling adjacent to lateral malleolus.  Ankle mortise is preserved.  Tiny plantar spur of the calcaneus.   Original Report Authenticated By: Natasha Mead, M.D.  No diagnosis found.    MDM  Sinusitis, old avulsion fracture right distal fibula  Mild to moderate symptoms of clear/yellow nasal discharge/congestion and scratchy throat with cough for less than 10 days.  Patient is afebrile.  No concern for acute bacterial rhinosinusitis; likely viral in nature.  Patient discharged with symptomatic treatment.  Patient instructions given for warm saline nasal washes.  Recommendations for follow-up with primary care physician.  Advised RICE treatment of old fracture and ortho follow up if symptoms persist.          Jaci Carrel, PA-C 08/04/12 [address] [place] 08/04/12 1407

## 2014-07-26 IMAGING — CR DG ANKLE COMPLETE 3+V*R*
1 series · 3 of 3 positions shown · non-contrast
Comparison: None.

CLINICAL DATA: Ankle pain, facial swelling

RIGHT ANKLE - COMPLETE 3+ VIEW

[Series 1: AP · right · 3 of 3 slices shown]
[im 1/3]
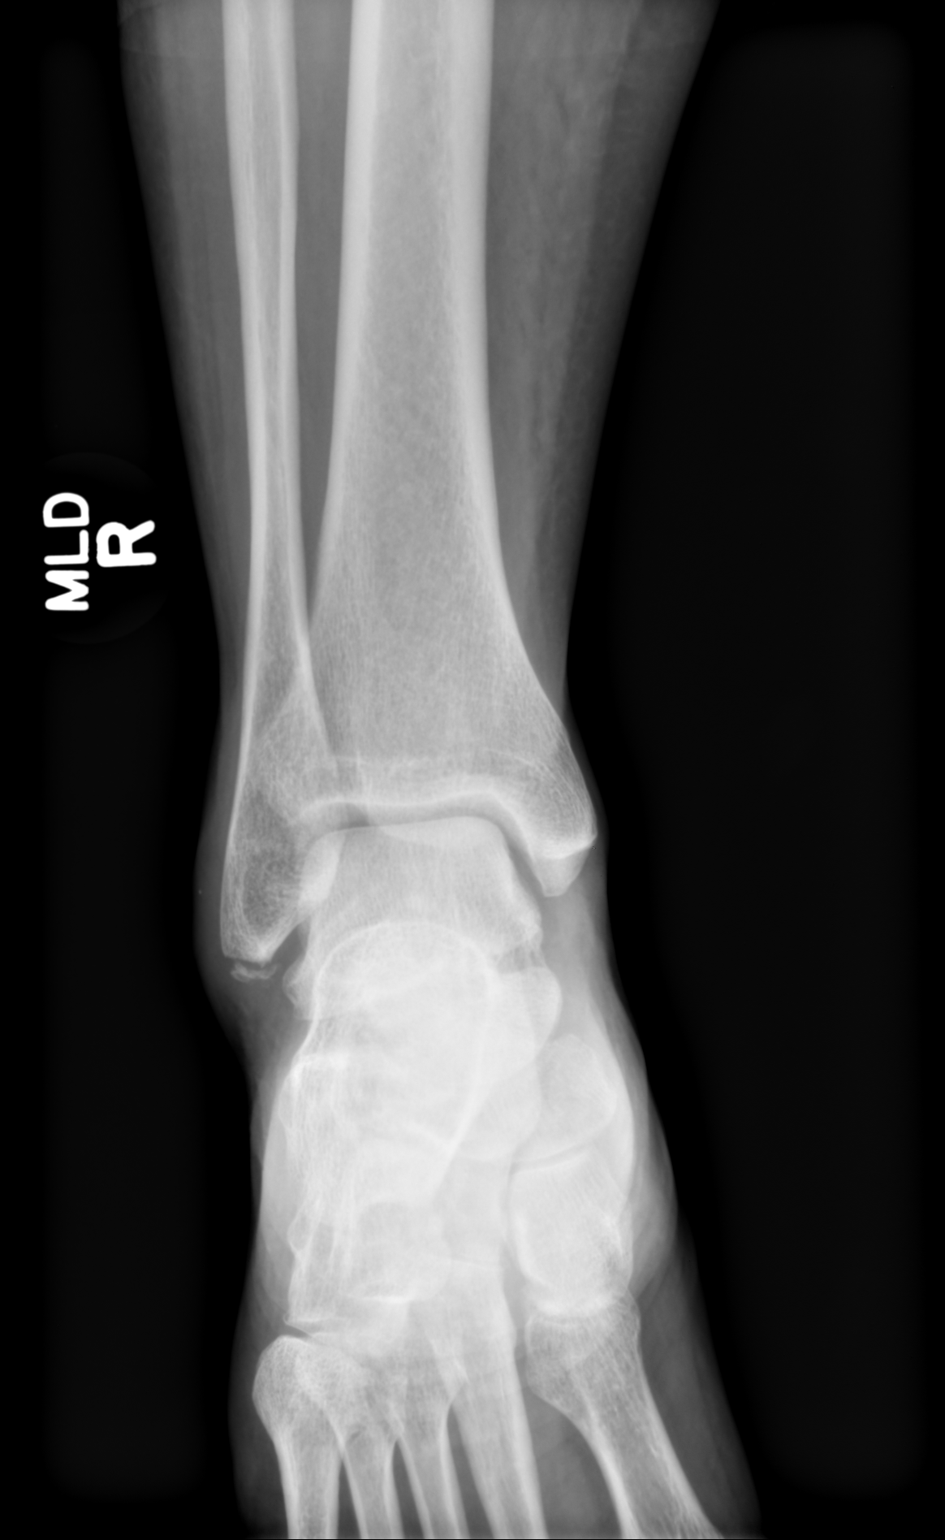
[im 2/3]
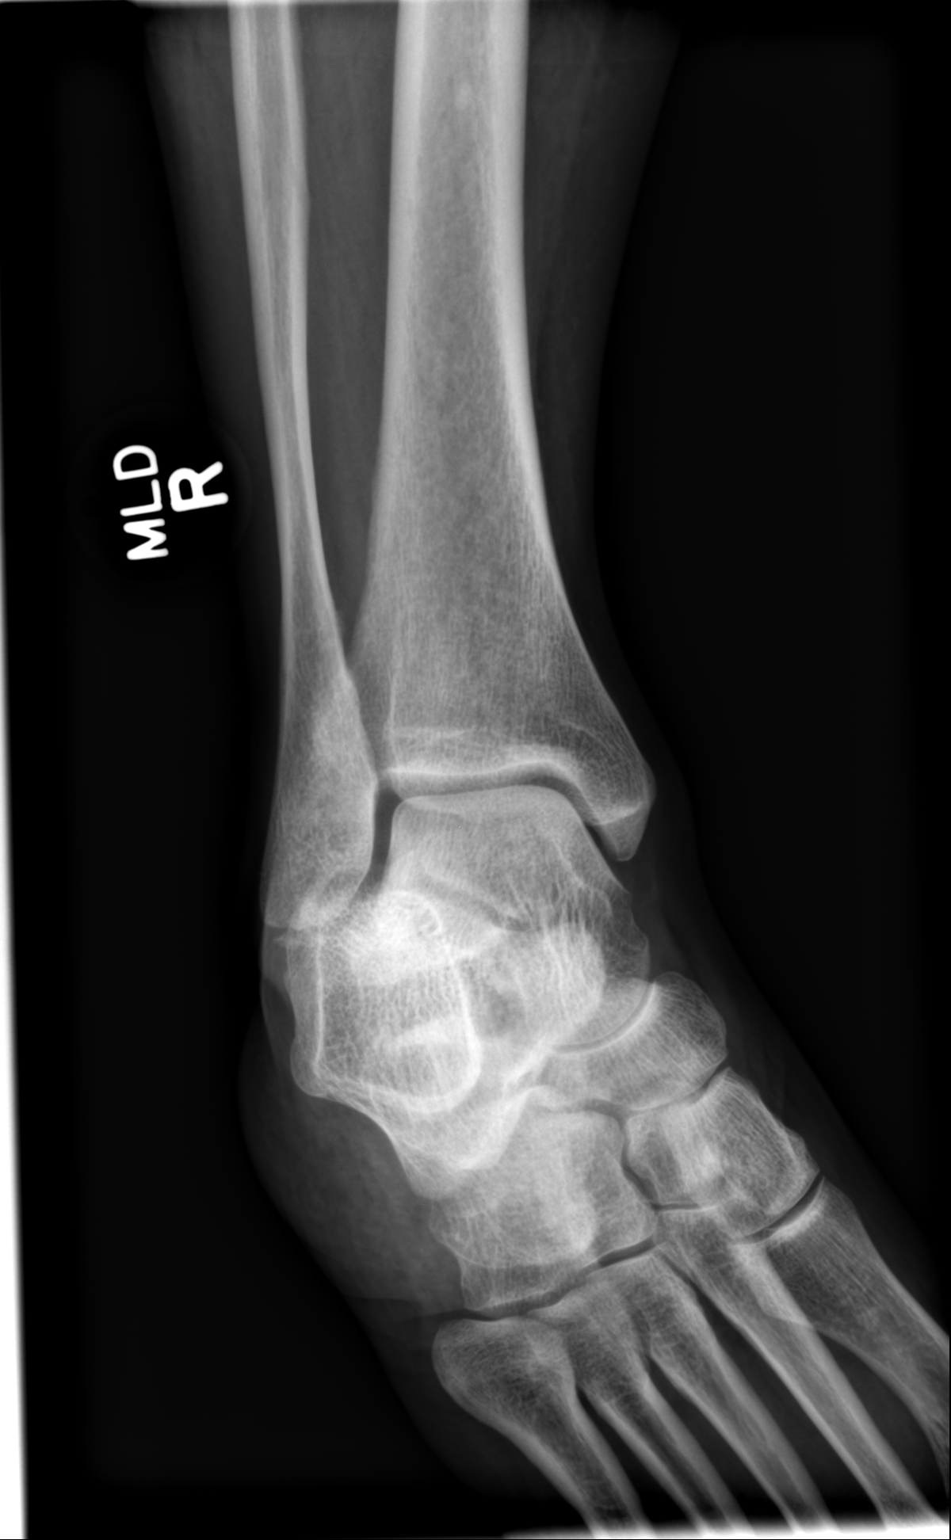
[im 3/3]
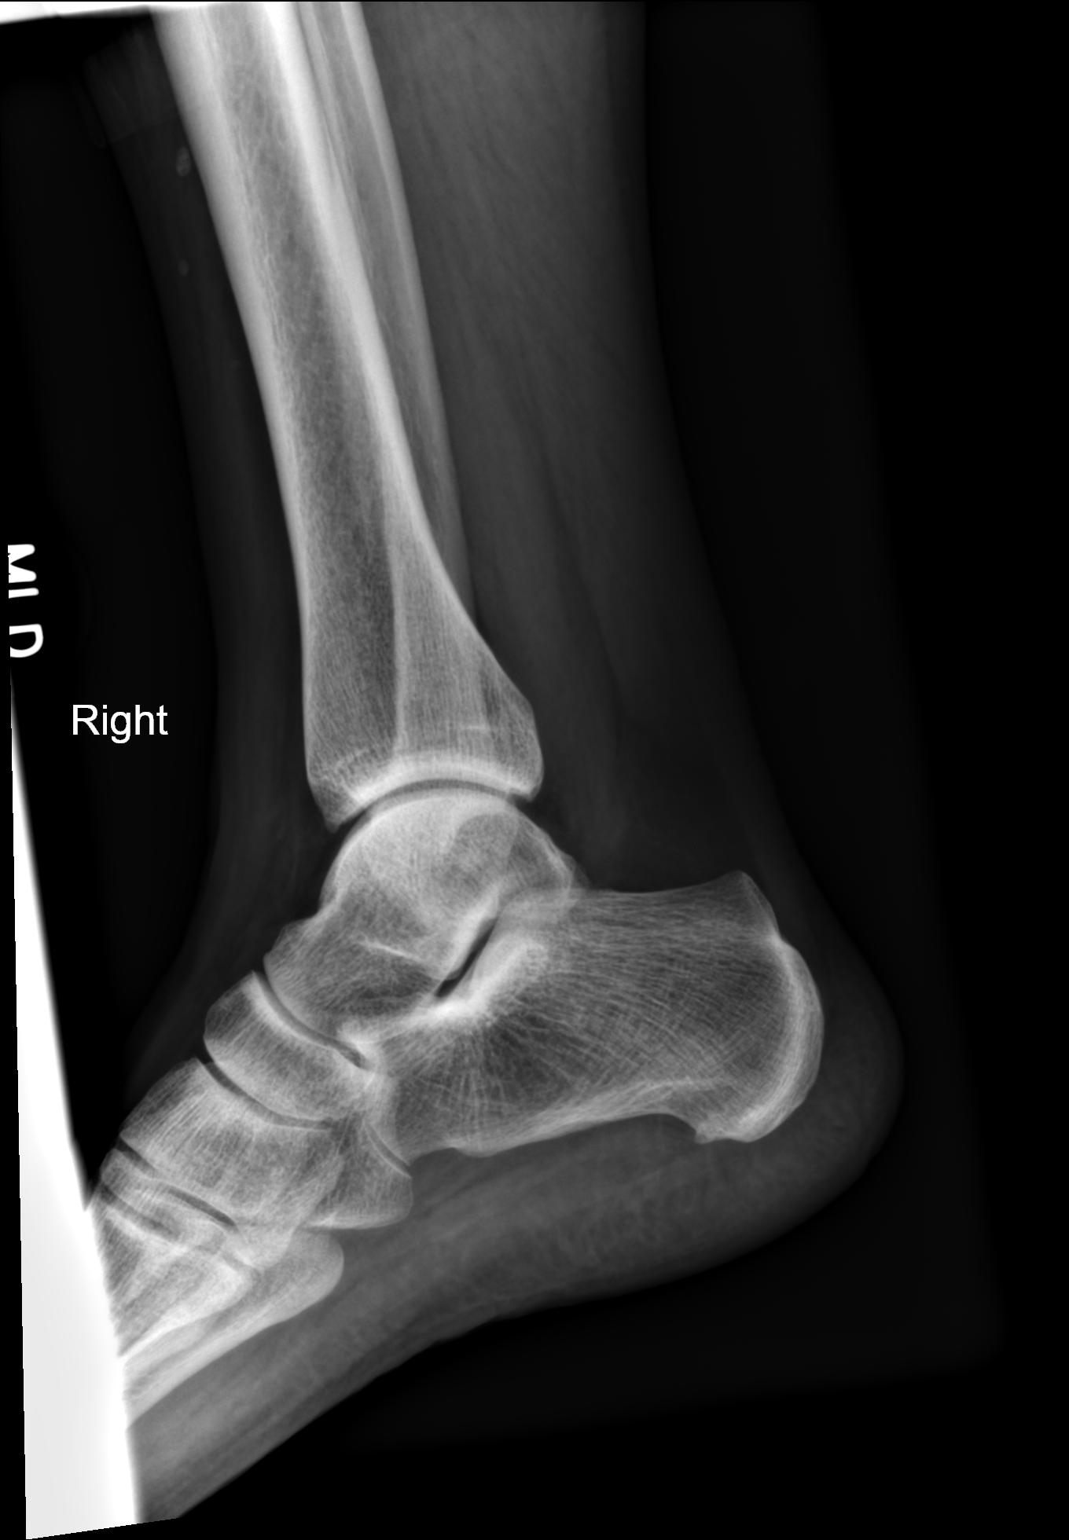

[3 of 3 positions shown; findings below may reference images not displayed]

FINDINGS: Three views of the right ankle submitted.  There is a
small avulsion fracture at the tip of distal fibula of
indeterminate age.  Mild soft tissue swelling adjacent to lateral
malleolus.  Ankle mortise is preserved.  Tiny plantar spur of the
calcaneus.
IMPRESSION: There is a small avulsion fracture at the tip of distal fibula of
indeterminate age.  Mild soft tissue swelling adjacent to lateral
malleolus.  Ankle mortise is preserved.  Tiny plantar spur of the
calcaneus.

## 2017-10-18 ENCOUNTER — Other Ambulatory Visit: Payer: Self-pay

## 2017-10-18 ENCOUNTER — Emergency Department (HOSPITAL_COMMUNITY)
Admission: EM | Admit: 2017-10-18 | Discharge: 2017-10-18 | Disposition: A | Discharge status: Home / Self Care | Payer: Self-pay | Attending: Emergency Medicine | Admitting: Emergency Medicine

## 2017-10-18 ENCOUNTER — Encounter (HOSPITAL_COMMUNITY): Payer: Self-pay | Admitting: *Deleted

## 2017-10-18 DIAGNOSIS — Z87891 Personal history of nicotine dependence: Secondary | ICD-10-CM | POA: Insufficient documentation

## 2017-10-18 DIAGNOSIS — I10 Essential (primary) hypertension: Secondary | ICD-10-CM | POA: Insufficient documentation

## 2017-10-18 DIAGNOSIS — N3001 Acute cystitis with hematuria: Secondary | ICD-10-CM | POA: Insufficient documentation

## 2017-10-18 DIAGNOSIS — Z79899 Other long term (current) drug therapy: Secondary | ICD-10-CM | POA: Insufficient documentation

## 2017-10-18 LAB — COMPREHENSIVE METABOLIC PANEL
ALBUMIN: 3.6 g/dL (ref 3.5–5.0)
ALT: 13 U/L — ABNORMAL LOW (ref 14–54)
ANION GAP: 13 (ref 5–15)
AST: 17 U/L (ref 15–41)
Alkaline Phosphatase: 80 U/L (ref 38–126)
BILIRUBIN TOTAL: 0.5 mg/dL (ref 0.3–1.2)
BUN: 14 mg/dL (ref 6–20)
CO2: 24 mmol/L (ref 22–32)
Calcium: 8.7 mg/dL — ABNORMAL LOW (ref 8.9–10.3)
Chloride: 101 mmol/L (ref 101–111)
Creatinine, Ser: 0.69 mg/dL (ref 0.44–1.00)
GFR calc non Af Amer: >60 mL/min (ref >60)
GLUCOSE: 96 mg/dL (ref 65–99)
POTASSIUM: 3.5 mmol/L (ref 3.5–5.1)
Sodium: 138 mmol/L (ref 135–145)
TOTAL PROTEIN: 6.7 g/dL (ref 6.5–8.1)

## 2017-10-18 LAB — CBC
HEMATOCRIT: 37.1 % (ref 36.0–46.0)
Hemoglobin: 11.7 g/dL — ABNORMAL LOW (ref 12.0–15.0)
MCH: 26.7 pg (ref 26.0–34.0)
MCHC: 31.5 g/dL (ref 30.0–36.0)
MCV: 84.5 fL (ref 78.0–100.0)
Platelets: 299 10*3/uL (ref 150–400)
RBC: 4.39 MIL/uL (ref 3.87–5.11)
RDW: 14.2 % (ref 11.5–15.5)
WBC: 11 10*3/uL — ABNORMAL HIGH (ref 4.0–10.5)

## 2017-10-18 LAB — PREGNANCY, URINE: PREG TEST UR: NEGATIVE

## 2017-10-18 LAB — URINALYSIS, ROUTINE W REFLEX MICROSCOPIC
BILIRUBIN URINE: NEGATIVE
GLUCOSE, UA: NEGATIVE mg/dL
HGB URINE DIPSTICK: NEGATIVE
Ketones, ur: NEGATIVE mg/dL
NITRITE: POSITIVE — AB
PROTEIN: 30 mg/dL — AB
Specific Gravity, Urine: 1.025 (ref 1.005–1.030)
pH: 5 (ref 5.0–8.0)

## 2017-10-18 MED ORDER — CEPHALEXIN 250 MG PO CAPS
500.0000 mg | ORAL_CAPSULE | Freq: Once | ORAL | Status: AC
  Administered 2017-10-18: 500 mg via ORAL
  Filled 2017-10-18: qty 2

## 2017-10-18 MED ORDER — CEPHALEXIN 500 MG PO CAPS: 500.0000 mg | ORAL_CAPSULE | Freq: Two times a day (BID) | ORAL | 0 refills | Status: AC

## 2017-10-18 NOTE — Discharge Instructions (Signed)
Please see the information and instructions below regarding your visit.  Your diagnoses today include:  1. Acute cystitis with hematuria    Your symptoms are caused by a urinary tract infection, which occurs when bacteria travel up into the bladder. This is a very common condition. Often, bacteria is transmitted while wiping after using the restroom. It is reassuring that you do not have a fever today.  Tests performed today include: See side panel of your discharge paperwork for testing performed today. Vital signs are listed at the bottom of these instructions.  -Urine tests. Suggestive of infection.  Medications prescribed:    Take any prescribed medications only as prescribed, and any over the counter medications only as directed on the packaging.  Please take all of your antibiotics until finished.   You may develop abdominal discomfort or nausea from the antibiotic. If this occurs, you may take it with food. Some patients also get diarrhea with antibiotics. You may help offset this with probiotics which you can buy or get in yogurt. Do not eat or take the probiotics until 2 hours after your antibiotic. Some women develop vaginal yeast infections after antibiotics. If you develop unusual vaginal discharge after being on this medication, please see your primary care provider.   Some people develop allergies to antibiotics. Symptoms of antibiotic allergy can be mild and include a flat rash and itching. They can also be more serious and include:  ?Hives - Hives are raised, red patches of skin that are usually very itchy.  ?Lip or tongue swelling  ?Trouble swallowing or breathing  ?Blistering of the skin or mouth.  If you have any of these serious symptoms, please seek emergency medical care immediately.   Use pyridium (or brand Azo) as directed to decrease painful urination but know that a common side effect is to turn your urine a bright orange/red color. This is not a harmful side  effect. Follow up with primary care physician in 1 week for recheck of ongoing symptoms.  Home care instructions:  Please follow any educational materials contained in this packet.  Please stay hydrated by making sure that your urine is very light yellow in color.    Follow-up instructions: Please follow-up with your primary care provider in one week for further evaluation of your symptoms.   Return instructions:  Please return to the Emergency Department if you experience worsening symptoms. Please seek immediate care if you develop the following: Your symptoms are no better or worse in 3 days. There is severe back pain or lower abdominal pain.  You develop chills.  You have a fever.  There is nausea or vomiting.  There is continued burning or discomfort with urination.  You have any additional concerns.  Please return if you have any other emergent concerns.  Additional Information:   Your vital signs today were: BP (!) 133/95 (BP Location: Right Arm)    Pulse 100    Temp 97.8 F (36.6 C) (Oral)    Resp 17    Ht 5\' 5"  (1.651 m)    Wt 113.4 kg (250 lb)    LMP 07/25/2012    SpO2 98%    BMI 41.60 kg/m  If your blood pressure (BP) was elevated on multiple readings during this visit above 130 for the top number or above 80 for the bottom number, please have this repeated by your primary care provider within one month. --------------  Thank you for allowing us to participate in your care today.

## 2017-10-18 NOTE — ED Notes (Signed)
ED Provider at bedside. 

## 2017-10-18 NOTE — ED Triage Notes (Signed)
The pt has had some painful urination and some blood when she wipes for 2 weeks     lmp none

## 2017-10-18 NOTE — ED Provider Notes (Addendum)
[facility] EMERGENCY DEPARTMENT Provider Note   CSN: [id number] Arrival date & time: 10/18/17  1621     History   Chief Complaint Chief Complaint  Patient presents with  . Hematuria    HPI Tammy Benton is a 47 y.o. female.  HPI  Patient is a 47 year old female with a history of anemia, depression, GERD, hypertension, obesity, and panic disorder presenting for dysuria and change in color of her urine.  Patient reports that she began having some vaginal irritation approximately a week ago and took over-the-counter Monistat.  She reports that  the symptoms have resolved and she has not had any vaginal discharge.  Patient reports that she has noticed a change in the color of her urine for approximately 1 week.  Today she noticed that after wiping she had a small streak of blood.  This is not persisted.  Patient has not had gross hematuria.  Patient reports it has been many years since she had a urinary tract infection.  Patient denies fevers, chills, flank pain, suprapubic pain, nausea, or vomiting.  Patient denies vaginal bleeding.  Patient has a history of a hysterectomy.  Patient reports she is followed by primary care patient denies sexual activity.  Past Medical History:  Diagnosis Date  . Anemia   . 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 Depression   . GERD (gastroesophageal reflux disease)   . HTN (hypertension)   . Obesity   . Panic disorder without agoraphobia     Patient Active Problem List   Diagnosis Date Noted  . OBESITY 06/19/2009  . PANIC ATTACK 06/19/2009  . DEPRESSION 06/19/2009  . HYPERTENSION 06/19/2009  . GERD 06/19/2009  . CHEST PAIN, ATYPICAL 06/19/2009    Past Surgical History:  Procedure Laterality Date  . ENDOMETRIOSIS s/p surgery    . TONSILLECTOMY AND ADENOIDECTOMY    . VESICOVAGINAL FISTULA CLOSURE W/ TAH      OB History    No data available       Home Medications      Prior to Admission medications   Medication Sig Start Date End Date Taking? Authorizing Provider  amoxicillin (AMOXIL) 250 MG capsule Take 1 capsule (250 mg total) by mouth 2 (two) times daily. 08/04/12   Paz, Lisette, PA-C  clonazePAM (KLONOPIN) 2 MG tablet Take 2 mg by mouth daily.     [provider]  diphenhydrAMINE (BENADRYL) 25 mg capsule Take 25 mg by mouth every 6 (six) hours as needed. allergies    [provider]  gabapentin (NEURONTIN) 300 MG capsule Take 600 mg by mouth 3 (three) times daily.    [provider]  HYDROcodone-acetaminophen (NORCO/VICODIN) 5-325 MG per tablet Take 1 tablet by mouth every 6 (six) hours as needed for pain. 08/04/12   Paz, Earlie Raveling, PA-C  HYDROcodone-acetaminophen (VICODIN) 5-500 MG per tablet Take 1 tablet by mouth every 6 (six) hours as needed. For pain.    [provider]  lisinopril-hydrochlorothiazide (PRINZIDE,ZESTORETIC) 10-12.5 MG per tablet Take 1 tablet by mouth daily.    [provider]  loratadine-pseudoephedrine (CLARITIN-D 24-HOUR) 10-240 MG per 24 hr tablet Take 1 tablet by mouth daily.    [provider]  omeprazole (PRILOSEC) 20 MG capsule Take 20 mg by mouth daily.    [provider]  sertraline (ZOLOFT) 100 MG tablet Take 100 mg by mouth daily.      [provider]  Family History Family History  Problem Relation Age of Onset  . Heart attack Unknown        both grandparents  . Hypertension Unknown        family hx  . Diabetes Unknown        family hx    Social History Social History   Tobacco Use  . Smoking status: Former Games developer  . Smokeless tobacco: Never Used  . Tobacco comment: does not smoke   Substance Use Topics  . Alcohol use: Yes    Comment: occasional   . Drug use: No     Allergies   Doxycycline   Review of Systems Review of Systems  Constitutional: Negative for chills and fever.  Gastrointestinal: Negative for abdominal pain,  nausea and vomiting.  Genitourinary: Positive for dysuria, frequency and hematuria. Negative for difficulty urinating, flank pain, vaginal bleeding and vaginal discharge.  All other systems reviewed and are negative.    Physical Exam Updated Vital Signs BP (!) 133/95 (BP Location: Right Arm)   Pulse 100   Temp 97.8 F (36.6 C) (Oral)   Resp 17   Ht  (1.651 m)   Wt 113.4 kg (250 lb)   LMP 07/25/2012   SpO2 98%   BMI 41.60 kg/m   Physical Exam  Constitutional: She appears well-developed and well-nourished. No distress.  HENT:  Head: Normocephalic and atraumatic.  Mouth/Throat: Oropharynx is clear and moist.  Eyes: Conjunctivae and EOM are normal. Pupils are equal, round, and reactive to light.  Neck: Normal range of motion. Neck supple.  Cardiovascular: Normal rate, regular rhythm, S1 normal and S2 normal.  No murmur heard. Pulmonary/Chest: Effort normal and breath sounds normal. She has no wheezes. She has no rales.  Abdominal: Soft. She exhibits no distension. There is no tenderness. There is no guarding.  No CVA tenderness.  Musculoskeletal: Normal range of motion. She exhibits no edema or deformity.  Lymphadenopathy:    She has no cervical adenopathy.  Neurological: She is alert.  Cranial nerves grossly intact. Patient moves extremities symmetrically and with good coordination.  Skin: Skin is warm and dry. No rash noted. No erythema.  Psychiatric: She has a normal mood and affect. Her behavior is normal. Judgment and thought content normal.  Nursing note and vitals reviewed.    ED Treatments / Results  Labs (all labs ordered are listed, but only abnormal results are displayed) Labs Reviewed  COMPREHENSIVE METABOLIC PANEL - Abnormal; Notable for the following components:      Result Value   Calcium 8.7 (*)    ALT 13 (*)    All other components within normal limits  CBC - Abnormal; Notable for the following components:   WBC 11.0 (*)    Hemoglobin 11.7 (*)     All other components within normal limits  URINALYSIS, ROUTINE W REFLEX MICROSCOPIC - Abnormal; Notable for the following components:   APPearance HAZY (*)    Protein, ur 30 (*)    Nitrite POSITIVE (*)    Leukocytes, UA MODERATE (*)    Bacteria, UA MANY (*)    Squamous Epithelial / LPF 0-5 (*)    All other components within normal limits  PREGNANCY, URINE  POC URINE PREG, ED    EKG  EKG Interpretation None       Radiology No results found.  Procedures Procedures (including critical care time)  Medications Ordered in ED Medications  cephALEXin (KEFLEX) capsule 500 mg (not administered)     Initial Impression /  Assessment and Plan / ED Course  I have reviewed the triage vital signs and the nursing notes.  Pertinent labs & imaging results that were available during my care of the patient were reviewed by me and considered in my medical decision making (see chart for details).     Patient is nontoxic-appearing, afebrile, and in no acute distress.  Patient with urinalysis significant for cystitis with hematuria.  Patient has no clinical signs or symptoms of pyelonephritis.  Additionally, no abdominal pain or vaginal discharge to suggest PID.  Patient has a history of a hysterectomy, therefore doubt vaginal bleeding in pregnancy as cause of patient's bleeding.  Patient exhibits a mild leukocytosis, but not concerning for systemic disease.  Hemoglobin is per baseline at 11.7.  Will treat with Keflex.  Instructed patient to follow-up with her primary care provider if she continues to see any discoloration of urine or blood in toilet paper, she may need to be assessed for vaginal bleeding or hematuria in the absence of infection.  Patient can return precautions for any abdominal pain, nausea, vomiting, fever, chills, flank pain.  Patient is in understanding and agrees with plan of care.  Final Clinical Impressions(s) / ED Diagnoses   Final diagnoses:  Acute cystitis with  hematuria    ED Discharge Orders        Ordered    cephALEXin (KEFLEX) 500 MG capsule  2 times daily     10/18/17 2313       Elisha Ponder, PA-C 10/18/17 2313    Aviva Kluver B, PA-C 10/18/17 2315    Loren Racer, MD 10/23/17 [phone]
